# Patient Record
Sex: Female | Born: 1995 | Race: White | Hispanic: No | Marital: Single | Smoking: Never smoker
Health system: Southern US, Community
[De-identification: ages and names within clinical notes are randomized; demographics above are authoritative.]

---
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
## 2020-03-17 ENCOUNTER — Other Ambulatory Visit: Payer: Self-pay

## 2020-03-17 ENCOUNTER — Emergency Department (INDEPENDENT_AMBULATORY_CARE_PROVIDER_SITE_OTHER)
Admission: RE | Admit: 2020-03-17 | Discharge: 2020-03-17 | Disposition: A | Discharge status: Home / Self Care | Payer: 59 | Source: Ambulatory Visit

## 2020-03-17 VITALS — BP 128/76 | HR 98 | Temp 99.0°F | Resp 15 | Ht 63.0 in | Wt 145.0 lb

## 2020-03-17 DIAGNOSIS — H1032 Unspecified acute conjunctivitis, left eye: Secondary | ICD-10-CM | POA: Diagnosis not present

## 2020-03-17 MED ORDER — ERYTHROMYCIN 5 MG/GM OP OINT: TOPICAL_OINTMENT | Freq: Every day | OPHTHALMIC | 0 refills | Status: AC

## 2020-03-17 NOTE — Discharge Instructions (Signed)
Apply medications as prescribed If you develop blurry vision, increased eye discharge, or severe eye swelling please return to the urgent care to be reevaluated.

## 2020-03-17 NOTE — ED Triage Notes (Signed)
Pt noticed left eye was red about 5 hours ago w/ soreness w/ yellow drainage S/O had pink eye over the weekend  No COVID or Flu vaccine

## 2020-03-17 NOTE — ED Provider Notes (Signed)
[facility]    CSN: [id number] Arrival date & time: 03/17/20  1637      History   Chief Complaint Chief Complaint  Patient presents with   Conjunctivitis    HPI Beth [facility]rien is a 24 y.o. female comes to the urgent care with purulent drainage from the left eye which started today.  Patient had redness in the left eye over the weekend.  Her boyfriend had similar symptoms earlier in the week.  She then started having purulent discharge out of the left eye today.  No blurred vision.  No fever or chills.  No upper respiratory infection symptoms.Marland Kitchen   HPI  History reviewed. No pertinent past medical history.  There are no problems to display for this patient.   History reviewed. No pertinent surgical history.  OB History   No obstetric history on file.      Home Medications    Prior to Admission medications   Medication Sig Start Date End Date Taking? Authorizing Provider  albuterol (VENTOLIN HFA) 108 (90 Base) MCG/ACT inhaler Inhale into the lungs. 03/15/17  Yes [provider]  Cholecalciferol 25 MCG (1000 UT) tablet Take 1 tablet by mouth daily.    [provider]  erythromycin ophthalmic ointment Place into both eyes at bedtime for 5 days. Place a 1/2 inch ribbon of ointment into the lower eyelid. 03/17/20 03/22/20  Merrilee Jansky, MD  montelukast (SINGULAIR) 10 MG tablet Take 10 mg by mouth daily. 01/25/20   [provider]  Multiple Vitamin (MULTI-VITAMIN) tablet Take 1 tablet by mouth as needed.    [provider]  Norethin Ace-Eth Estrad-FE 1-20 MG-MCG(24) CAPS  03/12/20   [provider]  zinc gluconate 50 MG tablet Take by mouth.    [provider]    Family History Family History  Problem Relation Age of Onset   Healthy Mother    Hypertension Father    Epilepsy Sister     Social History Social History   Tobacco Use   Smoking status: Never Smoker   Smokeless tobacco: Never  Used  Building services engineer Use: Never used  Substance Use Topics   Alcohol use: Not Currently   Drug use: Never     Allergies   Cat hair extract   Review of Systems Review of Systems  HENT: Negative.  Negative for congestion.   Eyes: Positive for pain, discharge and redness. Negative for photophobia and visual disturbance.  Respiratory: Negative.   Cardiovascular: Negative.      Physical Exam Triage Vital Signs ED Triage Vitals  Enc Vitals Group     BP 03/17/20 1658 128/76     Pulse Rate 03/17/20 1658 98     Resp 03/17/20 1658 15     Temp 03/17/20 1658 99 F (37.2 C)     Temp Source 03/17/20 1658 Oral     SpO2 03/17/20 1658 98 %     Weight 03/17/20 1659 145 lb (65.8 kg)     Height 03/17/20 1659 5\' 3"  (1.6 m)     Head Circumference --      Peak Flow --      Pain Score 03/17/20 1659 3     Pain Loc --      Pain Edu? --      Excl. in GC? --    No data found.  Updated Vital Signs BP 128/76 (BP Location: Right Arm)    Pulse 98    Temp 99 F (  37.2 C) (Oral)    Resp 15    Ht 5\' 3"  (1.6 m)    Wt 65.8 kg    LMP 02/21/2020 (Exact Date) Comment: pill   SpO2 98%    BMI 25.69 kg/m   Visual Acuity Right Eye Distance: 20/40 Left Eye Distance: 20/40 Bilateral Distance: 20/30 (w/ glasses - nearsighted- last eye exam 1 year ago)  Right Eye Near:   Left Eye Near:    Bilateral Near:     Physical Exam Vitals and nursing note reviewed.  Constitutional:      General: She is not in acute distress.    Appearance: She is not ill-appearing.  HENT:     Right Ear: Tympanic membrane normal.     Left Ear: Tympanic membrane normal.     Nose: Nose normal.     Mouth/Throat:     Mouth: Mucous membranes are moist.  Eyes:     General: No scleral icterus.       Left eye: Discharge present.    Extraocular Movements: Extraocular movements intact.     Pupils: Pupils are equal, round, and reactive to light.  Cardiovascular:     Rate and Rhythm: Normal rate and regular rhythm.   Pulmonary:     Effort: Pulmonary effort is normal.     Breath sounds: Normal breath sounds.  Abdominal:     General: Abdomen is flat. Bowel sounds are normal.  Neurological:     Mental Status: She is alert.      UC Treatments / Results  Labs (all labs ordered are listed, but only abnormal results are displayed) Labs Reviewed - No data to display  EKG   Radiology No results found.  Procedures Procedures (including critical care time)  Medications Ordered in UC Medications - No data to display  Initial Impression / Assessment and Plan / UC Course  I have reviewed the triage vital signs and the nursing notes.  Pertinent labs & imaging results that were available during my care of the patient were reviewed by me and considered in my medical decision making (see chart for details).    1.  Acute bacterial conjunctivitis: Erythromycin eye ointment to be applied nightly for 5 days Please use warm wet cloth to clean eyelids if they are matted together in the mornings If you experience blurry vision, light sensitivity or double vision or pain with moving your eyeball please return to the urgent care to be reevaluated Final Clinical Impressions(s) / UC Diagnoses   Final diagnoses:  Acute bacterial conjunctivitis of left eye     Discharge Instructions     Apply medications as prescribed If you develop blurry vision, increased eye discharge, or severe eye swelling please return to the urgent care to be reevaluated.   ED Prescriptions    Medication Sig Dispense Auth. Provider   erythromycin ophthalmic ointment Place into both eyes at bedtime for 5 days. Place a 1/2 inch ribbon of ointment into the lower eyelid. 3.5 g Myka Hitz, 02/23/2020, MD     PDMP not reviewed this encounter.   Britta Mccreedy, MD 03/17/20 1827

## 2020-08-22 ENCOUNTER — Other Ambulatory Visit: Payer: Self-pay

## 2020-08-22 ENCOUNTER — Emergency Department (INDEPENDENT_AMBULATORY_CARE_PROVIDER_SITE_OTHER)
Admission: EM | Admit: 2020-08-22 | Discharge: 2020-08-22 | Disposition: A | Discharge status: Home / Self Care | Payer: 59 | Source: Home / Self Care

## 2020-08-22 DIAGNOSIS — R0981 Nasal congestion: Secondary | ICD-10-CM | POA: Diagnosis not present

## 2020-08-22 DIAGNOSIS — J069 Acute upper respiratory infection, unspecified: Secondary | ICD-10-CM

## 2020-08-22 MED ORDER — AMOXICILLIN-POT CLAVULANATE 875-125 MG PO TABS: 1.0000 | ORAL_TABLET | Freq: Two times a day (BID) | ORAL | 0 refills | Status: AC

## 2020-08-22 NOTE — Discharge Instructions (Signed)
Your covid and flu test are pending.  Start antibiotic if test are negative

## 2020-08-22 NOTE — ED Triage Notes (Signed)
Pt presents to Urgent Care with c/o nasal congestion, headache, and ear pressure x approx 1 week. Reporting greenish and blood-tinged mucus.

## 2020-08-24 LAB — COVID-19, FLU A+B NAA
Influenza A, NAA: NOT DETECTED
Influenza B, NAA: NOT DETECTED
SARS-CoV-2, NAA: NOT DETECTED

## 2020-08-24 NOTE — ED Provider Notes (Signed)
[facility]    CSN: [id number] Arrival date & time: 08/22/20  1155      History   Chief Complaint Chief Complaint  Patient presents with  . Nasal Congestion  . Headache  . Ear Fullness    HPI Beth [facility] is a 25 y.o. female.   The history is provided by the patient. No language interpreter was used.  Headache Pain location:  Frontal Radiates to:  Does not radiate Timing:  Constant Progression:  Worsening Chronicity:  New Similar to prior headaches: no   Relieved by:  Nothing Worsened by:  Nothing Ineffective treatments:  None tried Associated symptoms: facial pain and sore throat   Ear Fullness Associated symptoms include headaches.  Pt is concerned about sinus infection History reviewed. No pertinent past medical history.  There are no problems to display for this patient.   History reviewed. No pertinent surgical history.  OB History   No obstetric history on file.      Home Medications    Prior to Admission medications   Medication Sig Start Date End Date Taking? Authorizing Provider  amoxicillin-clavulanate (AUGMENTIN) 875-125 MG tablet Take 1 tablet by mouth every 12 (twelve) hours for 10 days. 08/22/20 09/01/20 Yes Elson Areas, PA-C  ibuprofen (ADVIL) 200 MG tablet Take 200 mg by mouth every 6 (six) hours as needed.   Yes [provider]  albuterol (VENTOLIN HFA) 108 (90 Base) MCG/ACT inhaler Inhale into the lungs. 03/15/17   [provider]  Cholecalciferol 25 MCG (1000 UT) tablet Take 1 tablet by mouth daily.    [provider]  montelukast (SINGULAIR) 10 MG tablet Take 10 mg by mouth daily. 01/25/20   [provider]  Multiple Vitamin (MULTI-VITAMIN) tablet Take 1 tablet by mouth as needed.    [provider]  Norethin Ace-Eth Estrad-FE 1-20 MG-MCG(24) CAPS  03/12/20   [provider]  zinc gluconate 50 MG tablet Take by mouth.    [provider]    Family  History Family History  Problem Relation Age of Onset  . Healthy Mother   . Hypertension Father   . Epilepsy Sister     Social History Social History   Tobacco Use  . Smoking status: Never Smoker  . Smokeless tobacco: Never Used  Vaping Use  . Vaping Use: Never used  Substance Use Topics  . Alcohol use: Yes    Comment: occasionally  . Drug use: Not Currently     Allergies   Cat hair extract   Review of Systems Review of Systems  HENT: Positive for sore throat.   Neurological: Positive for headaches.  All other systems reviewed and are negative.    Physical Exam Triage Vital Signs ED Triage Vitals  Enc Vitals Group     BP 08/22/20 1216 113/75     Pulse Rate 08/22/20 1216 (!) 101     Resp 08/22/20 1216 18     Temp 08/22/20 1216 98.4 F (36.9 C)     Temp src --      SpO2 08/22/20 1216 98 %     Weight 08/22/20 1213 150 lb (68 kg)     Height 08/22/20 1213 5\' 3"  (1.6 m)     Head Circumference --      Peak Flow --      Pain Score 08/22/20 1213 5     Pain Loc --      Pain Edu? --      Excl. in GC? --  No data found.  Updated Vital Signs BP 113/75 (BP Location: Right Arm)   Pulse (!) 101   Temp 98.4 F (36.9 C)   Resp 18   Ht 5\' 3"  (1.6 m)   Wt 68 kg   LMP 07/31/2020   SpO2 98%   BMI 26.57 kg/m   Visual Acuity Right Eye Distance:   Left Eye Distance:   Bilateral Distance:    Right Eye Near:   Left Eye Near:    Bilateral Near:     Physical Exam Vitals and nursing note reviewed.  Constitutional:      Appearance: She is well-developed.  HENT:     Head: Normocephalic.     Comments: Tender sinuses frontal and maxillary Cardiovascular:     Rate and Rhythm: Tachycardia present.  Pulmonary:     Effort: Pulmonary effort is normal.  Abdominal:     General: There is no distension.  Musculoskeletal:        General: Normal range of motion.     Cervical back: Normal range of motion.  Neurological:     Mental Status: She is alert and oriented  to person, place, and time.      UC Treatments / Results  Labs (all labs ordered are listed, but only abnormal results are displayed) Labs Reviewed  COVID-19, FLU A+B NAA   Narrative:    Test(s) 140142-Influenza A, NAA; 140143-Influenza B, NAA was developed and its performance characteristics determined by [facility]. It has not been cleared or approved by the Food and Drug Administration. Performed at:  [address] [place], [place]  [place] Lab Director: [id number] MD, Phone:  [phone]    EKG   Radiology No results found.  Procedures Procedures (including critical care time)  Medications Ordered in UC Medications - No data to display  Initial Impression / Assessment and Plan / UC Course  I have reviewed the triage vital signs and the nursing notes.  Pertinent labs & imaging results that were available during my care of the patient were reviewed by me and considered in my medical decision making (see chart for details).     MDM:  Pt advised to hold antibiotics until covid test.   Final Clinical Impressions(s) / UC Diagnoses   Final diagnoses:  Nasal congestion  Upper respiratory tract infection, unspecified type     Discharge Instructions     Your covid and flu test are pending.  Start antibiotic if test are negative    ED Prescriptions    Medication Sig Dispense Auth. Provider   amoxicillin-clavulanate (AUGMENTIN) 875-125 MG tablet Take 1 tablet by mouth every 12 (twelve) hours for 10 days. 20 tablet [id number], Elson Areas     PDMP not reviewed this encounter.   [place], Elson Areas 08/24/20 1540

## 2020-10-26 ENCOUNTER — Emergency Department (INDEPENDENT_AMBULATORY_CARE_PROVIDER_SITE_OTHER): Payer: 59

## 2020-10-26 ENCOUNTER — Emergency Department (INDEPENDENT_AMBULATORY_CARE_PROVIDER_SITE_OTHER)
Admission: RE | Admit: 2020-10-26 | Discharge: 2020-10-26 | Disposition: A | Discharge status: Home / Self Care | Payer: 59 | Source: Ambulatory Visit

## 2020-10-26 ENCOUNTER — Other Ambulatory Visit: Payer: Self-pay

## 2020-10-26 VITALS — BP 118/76 | HR 73 | Temp 98.5°F | Resp 17

## 2020-10-26 DIAGNOSIS — M62830 Muscle spasm of back: Secondary | ICD-10-CM

## 2020-10-26 DIAGNOSIS — S39012A Strain of muscle, fascia and tendon of lower back, initial encounter: Secondary | ICD-10-CM | POA: Diagnosis not present

## 2020-10-26 DIAGNOSIS — M545 Low back pain, unspecified: Secondary | ICD-10-CM | POA: Diagnosis not present

## 2020-10-26 LAB — POCT URINALYSIS DIP (MANUAL ENTRY)
Bilirubin, UA: NEGATIVE
Blood, UA: NEGATIVE
Glucose, UA: NEGATIVE mg/dL
Ketones, POC UA: NEGATIVE mg/dL
Leukocytes, UA: NEGATIVE
Nitrite, UA: NEGATIVE
Protein Ur, POC: NEGATIVE mg/dL
Spec Grav, UA: 1.015 (ref 1.010–1.025)
Urobilinogen, UA: 0.2 E.U./dL
pH, UA: 7 (ref 5.0–8.0)

## 2020-10-26 MED ORDER — BACLOFEN 10 MG PO TABS: 10.0000 mg | ORAL_TABLET | Freq: Three times a day (TID) | ORAL | 0 refills | Status: AC

## 2020-10-26 MED ORDER — PREDNISONE 20 MG PO TABS: ORAL_TABLET | ORAL | 0 refills | Status: AC
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
## 2020-10-26 NOTE — Discharge Instructions (Addendum)
Advised patient to take medication as directed with food to completion.  Advised patient may use baclofen daily, as needed.  Advised/encouraged patient to avoid offending activities affected area of lower back for the next 7 to 10 days.  Encourage patient increase daily water intake while taking these medications.

## 2020-10-26 NOTE — ED Triage Notes (Signed)
Pt c/o lower back pain x 1 week. Both side of lower back. Pain is intermittent. Denies urinary sxs but still worried about UTI. Pain 4/10
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
